# Patient Record
Sex: Male | Born: 1995 | Hispanic: Yes | Marital: Single | Smoking: Former smoker
Health system: Southern US, Community
[De-identification: ages and names within clinical notes are randomized; demographics above are authoritative.]

## PROBLEM LIST (undated history)

## (undated) DIAGNOSIS — I10 Essential (primary) hypertension: Secondary | ICD-10-CM

## (undated) DIAGNOSIS — E669 Obesity, unspecified: Secondary | ICD-10-CM

## (undated) DIAGNOSIS — E119 Type 2 diabetes mellitus without complications: Secondary | ICD-10-CM

## (undated) HISTORY — PX: NO PAST SURGERIES: SHX2092

---
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
## 2016-06-08 ENCOUNTER — Other Ambulatory Visit
Admission: RE | Admit: 2016-06-08 | Discharge: 2016-06-08 | Disposition: A | Discharge status: Home / Self Care | Payer: Medicaid Other | Source: Ambulatory Visit | Attending: Pediatrics | Admitting: Pediatrics

## 2016-06-08 DIAGNOSIS — R1110 Vomiting, unspecified: Secondary | ICD-10-CM | POA: Insufficient documentation

## 2016-06-08 LAB — CBC WITH DIFFERENTIAL/PLATELET
Basophils Absolute: 0 10*3/uL (ref 0–0.1)
Basophils Relative: 0 %
EOS PCT: 3 %
Eosinophils Absolute: 0.2 10*3/uL (ref 0–0.7)
HCT: 42.4 % (ref 40.0–52.0)
Hemoglobin: 15 g/dL (ref 13.0–18.0)
LYMPHS ABS: 2 10*3/uL (ref 1.0–3.6)
LYMPHS PCT: 33 %
MCH: 31.7 pg (ref 26.0–34.0)
MCHC: 35.3 g/dL (ref 32.0–36.0)
MCV: 89.8 fL (ref 80.0–100.0)
MONO ABS: 0.6 10*3/uL (ref 0.2–1.0)
MONOS PCT: 10 %
Neutro Abs: 3.3 10*3/uL (ref 1.4–6.5)
Neutrophils Relative %: 54 %
PLATELETS: 261 10*3/uL (ref 150–440)
RBC: 4.73 MIL/uL (ref 4.40–5.90)
RDW: 13.1 % (ref 11.5–14.5)
WBC: 6.1 10*3/uL (ref 3.8–10.6)

## 2016-06-08 LAB — AMYLASE: Amylase: 44 U/L (ref 28–100)

## 2016-06-08 LAB — COMPREHENSIVE METABOLIC PANEL
ALT: 27 U/L (ref 17–63)
AST: 23 U/L (ref 15–41)
Albumin: 4.6 g/dL (ref 3.5–5.0)
Alkaline Phosphatase: 61 U/L (ref 38–126)
Anion gap: 7 (ref 5–15)
BILIRUBIN TOTAL: 0.7 mg/dL (ref 0.3–1.2)
BUN: 20 mg/dL (ref 6–20)
CHLORIDE: 104 mmol/L (ref 101–111)
CO2: 25 mmol/L (ref 22–32)
Calcium: 9.3 mg/dL (ref 8.9–10.3)
Creatinine, Ser: 1.08 mg/dL (ref 0.61–1.24)
Glucose, Bld: 108 mg/dL — ABNORMAL HIGH (ref 65–99)
POTASSIUM: 4.2 mmol/L (ref 3.5–5.1)
Sodium: 136 mmol/L (ref 135–145)
TOTAL PROTEIN: 7.5 g/dL (ref 6.5–8.1)

## 2016-06-08 LAB — TSH: TSH: 0.554 u[IU]/mL (ref 0.350–4.500)

## 2016-06-08 LAB — LIPASE, BLOOD: LIPASE: 20 U/L (ref 11–51)

## 2016-06-09 LAB — HEMOGLOBIN A1C
Hgb A1c MFr Bld: 5.4 % (ref 4.8–5.6)
MEAN PLASMA GLUCOSE: 108 mg/dL

## 2016-06-12 LAB — INSULIN, RANDOM: INSULIN: 65 u[IU]/mL — AB (ref 2.6–24.9)

## 2017-02-12 ENCOUNTER — Emergency Department: Payer: Self-pay

## 2017-02-12 ENCOUNTER — Observation Stay
Admission: EM | Admit: 2017-02-12 | Discharge: 2017-02-13 | Disposition: A | Discharge status: Home / Self Care | Payer: Self-pay | Attending: Internal Medicine | Admitting: Internal Medicine

## 2017-02-12 DIAGNOSIS — M6282 Rhabdomyolysis: Secondary | ICD-10-CM | POA: Diagnosis present

## 2017-02-12 DIAGNOSIS — I10 Essential (primary) hypertension: Secondary | ICD-10-CM | POA: Diagnosis present

## 2017-02-12 DIAGNOSIS — Z87891 Personal history of nicotine dependence: Secondary | ICD-10-CM | POA: Insufficient documentation

## 2017-02-12 DIAGNOSIS — E669 Obesity, unspecified: Secondary | ICD-10-CM | POA: Insufficient documentation

## 2017-02-12 DIAGNOSIS — R55 Syncope and collapse: Secondary | ICD-10-CM | POA: Insufficient documentation

## 2017-02-12 DIAGNOSIS — E860 Dehydration: Secondary | ICD-10-CM | POA: Diagnosis present

## 2017-02-12 DIAGNOSIS — R748 Abnormal levels of other serum enzymes: Secondary | ICD-10-CM

## 2017-02-12 DIAGNOSIS — X30XXXA Exposure to excessive natural heat, initial encounter: Secondary | ICD-10-CM | POA: Insufficient documentation

## 2017-02-12 DIAGNOSIS — T679XXA Effect of heat and light, unspecified, initial encounter: Principal | ICD-10-CM | POA: Insufficient documentation

## 2017-02-12 DIAGNOSIS — Y9389 Activity, other specified: Secondary | ICD-10-CM | POA: Insufficient documentation

## 2017-02-12 DIAGNOSIS — E119 Type 2 diabetes mellitus without complications: Secondary | ICD-10-CM

## 2017-02-12 HISTORY — DX: Type 2 diabetes mellitus without complications: E11.9

## 2017-02-12 HISTORY — DX: Obesity, unspecified: E66.9

## 2017-02-12 HISTORY — DX: Essential (primary) hypertension: I10

## 2017-02-12 LAB — CK: CK TOTAL: 1652 U/L — AB (ref 49–397)

## 2017-02-12 LAB — BASIC METABOLIC PANEL
Anion gap: 6 (ref 5–15)
BUN: 11 mg/dL (ref 6–20)
CO2: 22 mmol/L (ref 22–32)
CREATININE: 1.15 mg/dL (ref 0.61–1.24)
Calcium: 8.9 mg/dL (ref 8.9–10.3)
Chloride: 110 mmol/L (ref 101–111)
GFR calc Af Amer: >60 mL/min (ref >60)
GFR calc non Af Amer: >60 mL/min (ref >60)
GLUCOSE: 128 mg/dL — AB (ref 65–99)
POTASSIUM: 3.6 mmol/L (ref 3.5–5.1)
SODIUM: 138 mmol/L (ref 135–145)

## 2017-02-12 LAB — HEPATIC FUNCTION PANEL
ALT: 37 U/L (ref 17–63)
AST: 59 U/L — AB (ref 15–41)
Albumin: 4.5 g/dL (ref 3.5–5.0)
Alkaline Phosphatase: 41 U/L (ref 38–126)
BILIRUBIN DIRECT: 0.2 mg/dL (ref 0.1–0.5)
BILIRUBIN TOTAL: 1.2 mg/dL (ref 0.3–1.2)
Indirect Bilirubin: 1 mg/dL — ABNORMAL HIGH (ref 0.3–0.9)
Total Protein: 7.3 g/dL (ref 6.5–8.1)

## 2017-02-12 LAB — CBC WITH DIFFERENTIAL/PLATELET
Basophils Absolute: 0.2 10*3/uL — ABNORMAL HIGH (ref 0–0.1)
Basophils Relative: 2 %
EOS ABS: 0.1 10*3/uL (ref 0–0.7)
EOS PCT: 1 %
HCT: 39 % — ABNORMAL LOW (ref 40.0–52.0)
Hemoglobin: 13.8 g/dL (ref 13.0–18.0)
LYMPHS ABS: 1.2 10*3/uL (ref 1.0–3.6)
Lymphocytes Relative: 11 %
MCH: 32.1 pg (ref 26.0–34.0)
MCHC: 35.3 g/dL (ref 32.0–36.0)
MCV: 90.7 fL (ref 80.0–100.0)
MONO ABS: 0.9 10*3/uL (ref 0.2–1.0)
MONOS PCT: 8 %
Neutro Abs: 9 10*3/uL — ABNORMAL HIGH (ref 1.4–6.5)
Neutrophils Relative %: 78 %
PLATELETS: 291 10*3/uL (ref 150–440)
RBC: 4.3 MIL/uL — ABNORMAL LOW (ref 4.40–5.90)
RDW: 13.3 % (ref 11.5–14.5)
WBC: 11.4 10*3/uL — ABNORMAL HIGH (ref 3.8–10.6)

## 2017-02-12 MED ORDER — SODIUM CHLORIDE 0.9 % IV BOLUS (SEPSIS)
1000.0000 mL | Freq: Once | INTRAVENOUS | Status: AC
  Administered 2017-02-12: 1000 mL via INTRAVENOUS

## 2017-02-12 NOTE — ED Notes (Addendum)
Pt brought in via ems.  Pt reports he has been working in the heat and is not eating or drinking much.  Today pt fell and struck head on pavement.  Pt has abrasion to right forehead.  Pt states loc.  Pt alert on arrival  Iv fluids infusing.  2 iv's in place started by ems.   nsr on monitor.    md at bedside,

## 2017-02-12 NOTE — ED Provider Notes (Signed)
[facility] Emergency Department Provider Note    First MD Initiated Contact with Patient 02/12/17 2000     (approximate)  I have reviewed the triage vital signs and the nursing notes.   HISTORY  Chief Complaint Heat Exposure and Fall    HPI Jeffrey Frank is a 21 y.o. 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 Patient does not remember the events. States he was feeling very hot throughout the day and did not have anything to eat or drink since early this morning. Patient denies any history of syncopal events. Has no family history of sudden cardiac death. Does not have any chest pain or shortness of breath at this time. There is no numbness or tingling. Denies any headache. Patient was brought to the ER by EMS. Glucose is normal. Mildly hyperthermic as well is having some mild tachycardia. Patient does have scattered abrasions to the right forehead but is otherwise well-appearing.   Past Medical History:  Diagnosis Date  . Diabetes mellitus without complication (HCC)    FMH: no sudden cardiac death History reviewed. No pertinent surgical history. There are no active problems to display for this patient.     Prior to Admission medications   Not on File    Allergies Patient has no known allergies.    Social History Social History  Substance Use Topics  . Smoking status: Former Games developer  . Smokeless tobacco: Never Used  . Alcohol use 6.0 oz/week    10 Cans of beer per week     Comment: every 2 weeks    Review of Systems Patient denies headaches, rhinorrhea, blurry vision, numbness, shortness of breath, chest pain, edema, cough, abdominal pain, nausea, vomiting, diarrhea, dysuria, fevers, rashes or hallucinations unless otherwise stated above in HPI. ____________________________________________   PHYSICAL EXAM:  VITAL SIGNS: Vitals:   02/12/17 1958 02/12/17  2012  BP: (!) 136/91 133/79  Pulse: (!) 104 87  Resp: (!) 32 (!) 23  Temp: 99.4 F (37.4 C)     Constitutional: Alert and oriented. Well appearing and in no acute distress. Eyes: Conjunctivae are normal.  Head: superficial abrasion to right forehead Nose: No congestion/rhinnorhea. Mouth/Throat: Mucous membranes are moist.   Neck: No stridor. Painless ROM.  Cardiovascular: Normal rate, regular rhythm. Grossly normal heart sounds.  Good peripheral circulation. Respiratory: Normal respiratory effort.  No retractions. Lungs CTAB. Gastrointestinal: Soft and nontender. No distention. No abdominal bruits. No CVA tenderness. Musculoskeletal: No lower extremity tenderness nor edema.  No joint effusions. Neurologic:  Normal speech and language. No gross focal neurologic deficits are appreciated. No facial droop Skin:  Skin is warm, dry and intact. No rash noted. Psychiatric: Mood and affect are normal. Speech and behavior are normal.  ____________________________________________   LABS (all labs ordered are listed, but only abnormal results are displayed)  Results for orders placed or performed during the hospital encounter of 02/12/17 (from the past 24 hour(s))  CBC with Differential/Platelet     Status: Abnormal   Collection Time: 02/12/17  8:02 PM  Result Value Ref Range   WBC 11.4 (H) 3.8 - 10.6 K/uL   RBC 4.30 (L) 4.40 - 5.90 MIL/uL   Hemoglobin 13.8 13.0 - 18.0 g/dL   HCT 16.1 (L) 09.6 - 04.5 %   MCV 90.7 80.0 - 100.0 fL   MCH 32.1 26.0 - 34.0 pg   MCHC 35.3 32.0 - 36.0 g/dL  RDW 13.3 11.5 - 14.5 %   Platelets 291 150 - 440 K/uL   Neutrophils Relative % 78 %   Neutro Abs 9.0 (H) 1.4 - 6.5 K/uL   Lymphocytes Relative 11 %   Lymphs Abs 1.2 1.0 - 3.6 K/uL   Monocytes Relative 8 %   Monocytes Absolute 0.9 0.2 - 1.0 K/uL   Eosinophils Relative 1 %   Eosinophils Absolute 0.1 0 - 0.7 K/uL   Basophils Relative 2 %   Basophils Absolute 0.2 (H) 0 - 0.1 K/uL    ____________________________________________  EKG My review and personal interpretation at Time: 20:02   Indication: syncope  Rate: 90  Rhythm: sinus Axis: normal Other: normal intervals, no ischemic changes, no wpw, no brugada ____________________________________________  RADIOLOGY  I personally reviewed all radiographic images ordered to evaluate for the above acute complaints and reviewed radiology reports and findings.  These findings were personally discussed with the patient.  Please see medical record for radiology report.  ____________________________________________   PROCEDURES  Procedure(s) performed:  Procedures    Critical Care performed: no ____________________________________________   INITIAL IMPRESSION / ASSESSMENT AND PLAN / ED COURSE  Pertinent labs & imaging results that were available during my care of the patient were reviewed by me and considered in my medical decision making (see chart for details).  DDX: dehydration, heat syncope, electroyle ab, hypoglycemia, dysrhythmia, ich  Jeffrey Frank is a 21 y.o. who presents to the ED with syncopal event as described above. Patient otherwise well appearing. CT imaging will be ordered to evaluate for head injury after syncopal he did have LOC.  No focal neuro deficits at this time. EKG shows no evidence of WPW, Brugada or prolonged QT. Patient does clinically look dehydrated and admits prolonged period of working outside without good oral hydration therefore we will give IV fluids. Patient will be signed out to Dr. Sharma CovertNorman pending results of CT imaging and lab tests. The patient's well appearing and do feel that he be appropriate for discharge home pending reassuring lab work can clinical course.      ____________________________________________   FINAL CLINICAL IMPRESSION(S) / ED DIAGNOSES  Final diagnoses:  Syncope, unspecified syncope type      NEW MEDICATIONS STARTED DURING THIS VISIT:  New  Prescriptions   No medications on file     Note:  This document was prepared using Dragon voice recognition software and may include unintentional dictation errors.    Willy Eddyobinson, Saurabh Hettich, MD 02/12/17 2053

## 2017-02-12 NOTE — H&P (Signed)
[facility] - [place] at [facility]   PATIENT NAME: Jeffrey Frank    MR#:  [id number]  DATE OF BIRTH:  1996-05-15  DATE OF ADMISSION:  02/12/2017  PRIMARY CARE PHYSICIAN: Patient, No Pcp Per   REQUESTING/REFERRING PHYSICIAN: Sharma Covert, MD  CHIEF COMPLAINT:   Chief Complaint  Patient presents with  . Heat Exposure  . Fall    HISTORY OF PRESENT ILLNESS:  Josean Lycan  is a 21 y.o. male who presents with Syncopal episode after significant heat exposure. 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 He states that he currently has muscular tenderness, especially in his legs. Here in the ED he was found to be significantly dehydrated, with elevation in his CK.  PAST MEDICAL HISTORY:   Past Medical History:  Diagnosis Date  . Diabetes mellitus without complication (HCC)   . HTN (hypertension)   . Obesity     PAST SURGICAL HISTORY:   Past Surgical History:  Procedure Laterality Date  . NO PAST SURGERIES      SOCIAL HISTORY:   Social History  Substance Use Topics  . Smoking status: Former Games developer  . Smokeless tobacco: Never Used  . Alcohol use 6.0 oz/week    10 Cans of beer per week     Comment: every 2 weeks    FAMILY HISTORY:   Family History  Problem Relation Age of Onset  . Obesity Other   . Diabetes Other     DRUG ALLERGIES:  No Known Allergies  MEDICATIONS AT HOME:   Prior to Admission medications   Not on File    REVIEW OF SYSTEMS:  Review of Systems  Constitutional: Negative for chills, fever, malaise/fatigue and weight loss.  HENT: Negative for ear pain, hearing loss and tinnitus.   Eyes: Negative for blurred vision, double vision, pain and redness.  Respiratory: Negative for cough, hemoptysis and shortness of breath.   Cardiovascular: Negative for chest pain, palpitations, orthopnea and leg swelling.  Gastrointestinal: Negative for abdominal pain, constipation,  diarrhea, nausea and vomiting.  Genitourinary: Negative for dysuria, frequency and hematuria.  Musculoskeletal: Negative for back pain, joint pain and neck pain.       Muscular tenderness, especially thigh muscle tenderness  Skin:       No acne, rash, or lesions  Neurological: Positive for loss of consciousness. Negative for dizziness, tremors, focal weakness and weakness.  Endo/Heme/Allergies: Negative for polydipsia. Does not bruise/bleed easily.  Psychiatric/Behavioral: Negative for depression. The patient is not nervous/anxious and does not have insomnia.      VITAL SIGNS:   Vitals:   02/12/17 2012 02/12/17 2100 02/12/17 2120 02/12/17 2140  BP: 133/79 140/73 122/74 119/70  Pulse: 87 90 71 66  Resp: (!) 23 (!) 30 (!) 21 (!) 26  Temp:      TempSrc:      SpO2: 97% 98% 98% 98%  Weight:      Height:       Wt Readings from Last 3 Encounters:  02/12/17 95.3 kg (210 lb)    PHYSICAL EXAMINATION:  Physical Exam  Vitals reviewed. Constitutional: He is oriented to person, place, and time. He appears well-developed and well-nourished. No distress.  HENT:  Head: Normocephalic and atraumatic.  Dry mucous membranes  Eyes: Conjunctivae and EOM are normal. Pupils are equal, round, and reactive to light. No scleral icterus.  Neck: Normal range of motion. Neck supple. No JVD present. No thyromegaly present.  Cardiovascular: Normal rate, regular rhythm and intact distal pulses.  Exam reveals no gallop and no friction rub.   No murmur heard. Respiratory: Effort normal and breath sounds normal. No respiratory distress. He has no wheezes. He has no rales.  GI: Soft. Bowel sounds are normal. He exhibits no distension. There is no tenderness.  Musculoskeletal: Normal range of motion. He exhibits tenderness (Thigh muscle tenderness). He exhibits no edema.  No arthritis, no gout  Lymphadenopathy:    He has no cervical adenopathy.  Neurological: He is alert and oriented to person, place, and  time. No cranial nerve deficit.  No dysarthria, no aphasia  Skin: Skin is warm and dry. No rash noted. No erythema.  Psychiatric: He has a normal mood and affect. His behavior is normal. Judgment and thought content normal.    LABORATORY PANEL:   CBC  Recent Labs Lab 02/12/17 2002  WBC 11.4*  HGB 13.8  HCT 39.0*  PLT 291   ------------------------------------------------------------------------------------------------------------------  Chemistries   Recent Labs Lab 02/12/17 2002  NA 138  K 3.6  CL 110  CO2 22  GLUCOSE 128*  BUN 11  CREATININE 1.15  CALCIUM 8.9  AST 59*  ALT 37  ALKPHOS 41  BILITOT 1.2   ------------------------------------------------------------------------------------------------------------------  Cardiac Enzymes No results for input(s): TROPONINI in the last 168 hours. ------------------------------------------------------------------------------------------------------------------  RADIOLOGY:  Ct Head Wo Contrast  Result Date: 02/12/2017 CLINICAL DATA:  Heat exhaustion. EXAM: CT HEAD WITHOUT CONTRAST TECHNIQUE: Contiguous axial images were obtained from the base of the skull through the vertex without intravenous contrast. COMPARISON:  None. FINDINGS: Brain: Gray-white differentiation is maintained. No CT evidence of acute large territory infarct. No intraparenchymal or extra-axial mass or hemorrhage. 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 Otherwise, normal size a configuration the ventricles and basilar cisterns. No midline shift. Vascular: No hyperdense vessel or unexpected calcification. Skull: No displaced calvarial fracture. Sinuses/Orbits: Limited visualization the paranasal sinuses and mastoid air cells is normal. No air-fluid levels. Other: Regional soft tissues appear normal. IMPRESSION: Negative noncontrast head CT. Electronically Signed   By: Simonne Come M.D.   On: 02/12/2017 20:58   Dg Chest Portable 1 View  Result Date:  02/12/2017 CLINICAL DATA:  Syncopal episode, now with heat exhaustion. EXAM: PORTABLE CHEST 1 VIEW COMPARISON:  None. FINDINGS: Borderline enlarged cardiac silhouette, potentially accentuated due to decreased lung volumes and AP projection. Minimal bibasilar heterogeneous opacities, left greater than right. No pleural effusion or pneumothorax. No evidence of edema. No acute osseus abnormalities. IMPRESSION: 1. Minimal bibasilar heterogeneous opacities, left greater than right, atelectasis versus infiltrate. Further evaluation with a PA and lateral chest radiograph may be obtained as clinically indicated. 2. Borderline cardiomegaly, potentially accentuated due to decreased lung volumes and AP projection. Electronically Signed   By: Simonne Come M.D.   On: 02/12/2017 20:56    EKG:   Orders placed or performed during the hospital encounter of 02/12/17  . EKG 12-Lead  . EKG 12-Lead    IMPRESSION AND PLAN:  Principal Problem:   Dehydration - IV fluids for hydration, kidney function is within normal limits Active Problems:   Rhabdomyolysis - elevated CK, IV fluids as above, monitor renal functioning, check a.m. CK level   Diabetes (HCC) - glucose checks, sliding scale insulin if needed   HTN (hypertension) - monitor, patient is not on home meds for this  All the records are reviewed and case discussed with ED provider. Management plans discussed with the patient and/or family.  DVT  PROPHYLAXIS: SubQ lovenox  GI PROPHYLAXIS: None  ADMISSION STATUS: Observation  CODE STATUS: Full Code Status History    This patient does not have a recorded code status. Please follow your organizational policy for patients in this situation.      TOTAL TIME TAKING CARE OF THIS PATIENT: 40 minutes.   Shay Jhaveri FIELDING 02/12/2017, 10:57 PM  [facility]  Office  [phone]  CC: Primary care physician; Patient, No Pcp Per  Note:  This document was prepared using Dragon voice  recognition software and may include unintentional dictation errors.

## 2017-02-12 NOTE — ED Provider Notes (Addendum)
This patient was signed out to me by Dr. Willy EddyPatrick Frank. 21 year old male with heat exposure and resulting syncopal episode. I will plan to admit the patient for an elevated CK of 1652, but his creatinine is reassuring and the patient is clinically improving in the emergency department.  ED ECG REPORT I, Rockne MenghiniNorman, Anne-Caroline, the attending physician, personally viewed and interpreted this ECG.   Date: 02/12/2017  EKG Time: 2002  Rate: 92  Rhythm: normal sinus rhythm  Axis: normal  Intervals:none  ST&T Change: No STEMI    Rockne MenghiniNorman, Anne-Caroline, MD 02/12/17 [id number]    Rockne MenghiniNorman, Anne-Caroline, MD 02/12/17 2206

## 2017-02-12 NOTE — ED Triage Notes (Signed)
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

## 2017-02-13 DIAGNOSIS — M6282 Rhabdomyolysis: Secondary | ICD-10-CM | POA: Diagnosis present

## 2017-02-13 LAB — CBC
HEMATOCRIT: 34.9 % — AB (ref 40.0–52.0)
Hemoglobin: 12.7 g/dL — ABNORMAL LOW (ref 13.0–18.0)
MCH: 32.4 pg (ref 26.0–34.0)
MCHC: 36.3 g/dL — AB (ref 32.0–36.0)
MCV: 89 fL (ref 80.0–100.0)
Platelets: 267 10*3/uL (ref 150–440)
RBC: 3.92 MIL/uL — ABNORMAL LOW (ref 4.40–5.90)
RDW: 13.3 % (ref 11.5–14.5)
WBC: 9.9 10*3/uL (ref 3.8–10.6)

## 2017-02-13 LAB — BASIC METABOLIC PANEL
Anion gap: 6 (ref 5–15)
BUN: 6 mg/dL (ref 6–20)
CHLORIDE: 113 mmol/L — AB (ref 101–111)
CO2: 23 mmol/L (ref 22–32)
Calcium: 8.5 mg/dL — ABNORMAL LOW (ref 8.9–10.3)
Creatinine, Ser: 0.97 mg/dL (ref 0.61–1.24)
GFR calc Af Amer: >60 mL/min (ref >60)
GFR calc non Af Amer: >60 mL/min (ref >60)
GLUCOSE: 102 mg/dL — AB (ref 65–99)
POTASSIUM: 3.2 mmol/L — AB (ref 3.5–5.1)
Sodium: 142 mmol/L (ref 135–145)

## 2017-02-13 LAB — CK: Total CK: 1196 U/L — ABNORMAL HIGH (ref 49–397)

## 2017-02-13 LAB — GLUCOSE, CAPILLARY
Glucose-Capillary: 105 mg/dL — ABNORMAL HIGH (ref 65–99)
Glucose-Capillary: 109 mg/dL — ABNORMAL HIGH (ref 65–99)

## 2017-02-13 MED ORDER — ENOXAPARIN SODIUM 40 MG/0.4ML ~~LOC~~ SOLN: 40.0000 mg | SUBCUTANEOUS | Status: DC

## 2017-02-13 MED ORDER — INSULIN ASPART 100 UNIT/ML ~~LOC~~ SOLN: 0.0000 [IU] | Freq: Three times a day (TID) | SUBCUTANEOUS | Status: DC

## 2017-02-13 MED ORDER — SODIUM CHLORIDE 0.9 % IV SOLN
INTRAVENOUS | Status: AC
  Administered 2017-02-13 (×2): via INTRAVENOUS

## 2017-02-13 MED ORDER — ACETAMINOPHEN 650 MG RE SUPP: 650.0000 mg | Freq: Four times a day (QID) | RECTAL | Status: DC | PRN

## 2017-02-13 MED ORDER — ACETAMINOPHEN 325 MG PO TABS
650.0000 mg | ORAL_TABLET | Freq: Four times a day (QID) | ORAL | Status: DC | PRN
  Administered 2017-02-13: 650 mg via ORAL
  Filled 2017-02-13: qty 2

## 2017-02-13 MED ORDER — ONDANSETRON HCL 4 MG PO TABS: 4.0000 mg | ORAL_TABLET | Freq: Four times a day (QID) | ORAL | Status: DC | PRN

## 2017-02-13 MED ORDER — INSULIN ASPART 100 UNIT/ML ~~LOC~~ SOLN: 0.0000 [IU] | Freq: Every day | SUBCUTANEOUS | Status: DC

## 2017-02-13 MED ORDER — POTASSIUM CHLORIDE CRYS ER 20 MEQ PO TBCR: 40.0000 meq | EXTENDED_RELEASE_TABLET | Freq: Once | ORAL | Status: DC

## 2017-02-13 MED ORDER — ONDANSETRON HCL 4 MG/2ML IJ SOLN: 4.0000 mg | Freq: Four times a day (QID) | INTRAMUSCULAR | Status: DC | PRN

## 2017-02-13 NOTE — ED Notes (Signed)
Report  Called to crystal rn floor nurse

## 2017-02-13 NOTE — Progress Notes (Signed)
Pt to be discharged per MD order. IV removed. Instructions reviewed with pt and family, all questions answered. NO new medications or follow ups. Will discharge downstairs in wheelchair.

## 2017-02-13 NOTE — Care Management (Signed)
Patient admitted with Heat exhaustion with Dehydration.  Patient is uninsured, self pay.  Patient will not discharge on any home medications.  Patient states that he does not have a PCP.  [facility].  RNCM signing off.

## 2017-02-13 NOTE — Discharge Instructions (Signed)
Advised plenty of fluids and gatorade when working outdoors

## 2017-02-13 NOTE — Discharge Summary (Signed)
[facility] - [place] at [facility]   PATIENT NAME: Jeffrey Frank    MR#:  [id number]  DATE OF BIRTH:  October 13, 1995  DATE OF ADMISSION:  02/12/2017 ADMITTING PHYSICIAN: Oralia Manis, MD  DATE OF DISCHARGE: 02/13/17  PRIMARY CARE PHYSICIAN: Patient, No Pcp Per    ADMISSION DIAGNOSIS:  Elevated CK [R74.8] Heat exposure, initial encounter [T67.9XXA] Syncope, unspecified syncope type [R55]  DISCHARGE DIAGNOSIS:  Heat exhaustion Mild acute Rhabdomyolysis  SECONDARY DIAGNOSIS:   Past Medical History:  Diagnosis Date  . Diabetes mellitus without complication (HCC)   . HTN (hypertension)   . Obesity     HOSPITAL COURSE:  Jeffrey Frank  is a 21 y.o. male who presents with Syncopal episode after significant heat exposure. 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 He states that he currently has muscular tenderness, especially in his legs. Here in the ED he was found to be significantly dehydrated, with elevation in his CK.  1. Heat exhaustion with Dehydration  -recieved IV fluids for hydration, kidney function is within normal limits  2.Acute mild  Rhabdomyolysis - elevated CK, IV fluids as above, monitor renal functioning -CK level trending down -pt advised gatorade and water when outdoors. -prn tylenol  3.  Diabetes (HCC) - glucose checks, sliding scale insulin if needed -sugars <110 -diet control  4.  HTN (hypertension) - monitor, patient is not on home meds for this BP stable. Does not need meds  D/c home later today  CONSULTS OBTAINED:    DRUG ALLERGIES:  No Known Allergies  DISCHARGE MEDICATIONS:  There are no discharge medications for this patient.   If you experience worsening of your admission symptoms, develop shortness of breath, life threatening emergency, suicidal or homicidal thoughts you must seek medical attention immediately by calling 911 or calling your MD immediately   if symptoms less severe.  You Must read complete instructions/literature along with all the possible adverse reactions/side effects for all the Medicines you take and that have been prescribed to you. Take any new Medicines after you have completely understood and accept all the possible adverse reactions/side effects.   Please note  You were cared for by a hospitalist during your hospital stay. If you have any questions about your discharge medications or the care you received while you were in the hospital after you are discharged, you can call the unit and asked to speak with the hospitalist on call if the hospitalist that took care of you is not available. Once you are discharged, your primary care physician will handle any further medical issues. Please note that NO REFILLS for any discharge medications will be authorized once you are discharged, as it is imperative that you return to your primary care physician (or establish a relationship with a primary care physician if you do not have one) for your aftercare needs so that they can reassess your need for medications and monitor your lab values. Today   SUBJECTIVE   Mild LE cramps. Eating well  VITAL SIGNS:  Blood pressure 127/64, pulse 61, temperature 99.9 F (37.7 C), temperature source Oral, resp. rate 18, height 5\' 5"  (1.651 m), weight 97.6 kg (215 lb 3.2 oz), SpO2 100 %.  I/O:   Intake/Output Summary (Last 24 hours) at 02/13/17 1047 Last data filed at 02/13/17 0900  Gross per 24 hour  Intake          1148.33 ml  Output  800 ml  Net           348.33 ml    PHYSICAL EXAMINATION:  GENERAL:  21 y.o.-year-old patient lying in the bed with no acute distress. obese EYES: Pupils equal, round, reactive to light and accommodation. No scleral icterus. Extraocular muscles intact.  HEENT: Head atraumatic, normocephalic. Oropharynx and nasopharynx clear.  NECK:  Supple, no jugular venous distention. No thyroid enlargement, no  tenderness.  LUNGS: Normal breath sounds bilaterally, no wheezing, rales,rhonchi or crepitation. No use of accessory muscles of respiration.  CARDIOVASCULAR: S1, S2 normal. No murmurs, rubs, or gallops.  ABDOMEN: Soft, non-tender, non-distended. Bowel sounds present. No organomegaly or mass.  EXTREMITIES: No pedal edema, cyanosis, or clubbing.  NEUROLOGIC: Cranial nerves II through XII are intact. Muscle strength 5/5 in all extremities. Sensation intact. Gait not checked.  PSYCHIATRIC: The patient is alert and oriented x 3.  SKIN: No obvious rash, lesion, or ulcer.   DATA REVIEW:   CBC   Recent Labs Lab 02/13/17 0505  WBC 9.9  HGB 12.7*  HCT 34.9*  PLT 267    Chemistries   Recent Labs Lab 02/12/17 2002 02/13/17 0505  NA 138 142  K 3.6 3.2*  CL 110 113*  CO2 22 23  GLUCOSE 128* 102*  BUN 11 6  CREATININE 1.15 0.97  CALCIUM 8.9 8.5*  AST 59*  --   ALT 37  --   ALKPHOS 41  --   BILITOT 1.2  --     Microbiology Results   No results found for this or any previous visit (from the past 240 hour(s)).  RADIOLOGY:  Ct Head Wo Contrast  Result Date: 02/12/2017 CLINICAL DATA:  Heat exhaustion. EXAM: CT HEAD WITHOUT CONTRAST TECHNIQUE: Contiguous axial images were obtained from the base of the skull through the vertex without intravenous contrast. COMPARISON:  None. FINDINGS: Brain: Gray-white differentiation is maintained. No CT evidence of acute large territory infarct. No intraparenchymal or extra-axial mass or hemorrhage. 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 Otherwise, normal size a configuration the ventricles and basilar cisterns. No midline shift. Vascular: No hyperdense vessel or unexpected calcification. Skull: No displaced calvarial fracture. Sinuses/Orbits: Limited visualization the paranasal sinuses and mastoid air cells is normal. No air-fluid levels. Other: Regional soft tissues appear normal. IMPRESSION: Negative noncontrast head CT. Electronically Signed   By:  Simonne Come M.D.   On: 02/12/2017 20:58   Dg Chest Portable 1 View  Result Date: 02/12/2017 CLINICAL DATA:  Syncopal episode, now with heat exhaustion. EXAM: PORTABLE CHEST 1 VIEW COMPARISON:  None. FINDINGS: Borderline enlarged cardiac silhouette, potentially accentuated due to decreased lung volumes and AP projection. Minimal bibasilar heterogeneous opacities, left greater than right. No pleural effusion or pneumothorax. No evidence of edema. No acute osseus abnormalities. IMPRESSION: 1. Minimal bibasilar heterogeneous opacities, left greater than right, atelectasis versus infiltrate. Further evaluation with a PA and lateral chest radiograph may be obtained as clinically indicated. 2. Borderline cardiomegaly, potentially accentuated due to decreased lung volumes and AP projection. Electronically Signed   By: Simonne Come M.D.   On: 02/12/2017 20:56     Management plans discussed with the patient, family and they are in agreement.  CODE STATUS:     Code Status Orders        Start     Ordered   02/13/17 0013  Full code  Continuous     02/13/17 0012    Code Status History    Date Active Date Inactive Code  Status Order ID Comments User Context   This patient has a current code status but no historical code status.      TOTAL TIME TAKING CARE OF THIS PATIENT: 40 minutes.    Hadassah Rana M.D on 02/13/2017 at 10:47 AM  Between 7am to 6pm - Pager - [phone] After 6pm go to [email or web address] - password [email or web address]  [facility]  Office  [phone]  CC: Primary care physician; Patient, No Pcp Per

## 2017-02-14 LAB — HEMOGLOBIN A1C
HEMOGLOBIN A1C: 5.1 % (ref 4.8–5.6)
Mean Plasma Glucose: 100 mg/dL

## 2017-07-14 ENCOUNTER — Other Ambulatory Visit: Payer: Self-pay

## 2017-07-14 ENCOUNTER — Emergency Department: Payer: Self-pay

## 2017-07-14 ENCOUNTER — Encounter: Payer: Self-pay | Admitting: Emergency Medicine

## 2017-07-14 ENCOUNTER — Emergency Department
Admission: EM | Admit: 2017-07-14 | Discharge: 2017-07-14 | Disposition: A | Discharge status: Home / Self Care | Payer: Self-pay | Attending: Emergency Medicine | Admitting: Emergency Medicine

## 2017-07-14 DIAGNOSIS — Z87891 Personal history of nicotine dependence: Secondary | ICD-10-CM | POA: Insufficient documentation

## 2017-07-14 DIAGNOSIS — E119 Type 2 diabetes mellitus without complications: Secondary | ICD-10-CM | POA: Insufficient documentation

## 2017-07-14 DIAGNOSIS — R569 Unspecified convulsions: Secondary | ICD-10-CM | POA: Insufficient documentation

## 2017-07-14 DIAGNOSIS — I10 Essential (primary) hypertension: Secondary | ICD-10-CM | POA: Insufficient documentation

## 2017-07-14 LAB — URINE DRUG SCREEN, QUALITATIVE (ARMC ONLY)
AMPHETAMINES, UR SCREEN: NOT DETECTED
BARBITURATES, UR SCREEN: NOT DETECTED
BENZODIAZEPINE, UR SCRN: NOT DETECTED
Cannabinoid 50 Ng, Ur ~~LOC~~: POSITIVE — AB
Cocaine Metabolite,Ur ~~LOC~~: NOT DETECTED
MDMA (Ecstasy)Ur Screen: NOT DETECTED
Methadone Scn, Ur: NOT DETECTED
Opiate, Ur Screen: NOT DETECTED
PHENCYCLIDINE (PCP) UR S: NOT DETECTED
Tricyclic, Ur Screen: NOT DETECTED

## 2017-07-14 LAB — URINALYSIS, COMPLETE (UACMP) WITH MICROSCOPIC
Bacteria, UA: NONE SEEN
Bilirubin Urine: NEGATIVE
Glucose, UA: >=500 mg/dL — AB
KETONES UR: 5 mg/dL — AB
Leukocytes, UA: NEGATIVE
Nitrite: NEGATIVE
PH: 6 (ref 5.0–8.0)
Protein, ur: 100 mg/dL — AB
SPECIFIC GRAVITY, URINE: 1.02 (ref 1.005–1.030)

## 2017-07-14 LAB — BASIC METABOLIC PANEL
Anion gap: 16 — ABNORMAL HIGH (ref 5–15)
BUN: 16 mg/dL (ref 6–20)
CHLORIDE: 102 mmol/L (ref 101–111)
CO2: 17 mmol/L — ABNORMAL LOW (ref 22–32)
CREATININE: 1.2 mg/dL (ref 0.61–1.24)
Calcium: 9.5 mg/dL (ref 8.9–10.3)
GFR calc Af Amer: >60 mL/min (ref >60)
Glucose, Bld: 244 mg/dL — ABNORMAL HIGH (ref 65–99)
Potassium: 3.3 mmol/L — ABNORMAL LOW (ref 3.5–5.1)
SODIUM: 135 mmol/L (ref 135–145)

## 2017-07-14 LAB — CBC
HCT: 45.6 % (ref 40.0–52.0)
Hemoglobin: 15.9 g/dL (ref 13.0–18.0)
MCH: 32 pg (ref 26.0–34.0)
MCHC: 34.9 g/dL (ref 32.0–36.0)
MCV: 91.6 fL (ref 80.0–100.0)
PLATELETS: 374 10*3/uL (ref 150–440)
RBC: 4.98 MIL/uL (ref 4.40–5.90)
RDW: 13.3 % (ref 11.5–14.5)
WBC: 12.4 10*3/uL — AB (ref 3.8–10.6)

## 2017-07-14 MED ORDER — LEVETIRACETAM 500 MG/5ML IV SOLN
1000.0000 mg | Freq: Once | INTRAVENOUS | Status: AC
  Administered 2017-07-14: 1000 mg via INTRAVENOUS
  Filled 2017-07-14: qty 10

## 2017-07-14 MED ORDER — SODIUM CHLORIDE 0.9 % IV BOLUS (SEPSIS)
1000.0000 mL | Freq: Once | INTRAVENOUS | Status: AC
  Administered 2017-07-14: 1000 mL via INTRAVENOUS

## 2017-07-14 NOTE — ED Triage Notes (Signed)
Patient presents to Emergency Department via EMS with complaints of mother witnessed seizure.  Pt has no hx of seizures.   Per EMS pt was confused to birth date and current event questions at first contact.  Pt reports xanax use stopped last month, cocaine ("snorting") last use last week, marijuana use today.

## 2017-07-14 NOTE — ED Provider Notes (Signed)
[facility] Emergency Department Provider Note   ____________________________________________   First MD Initiated Contact with Patient 07/14/17 0250     (approximate)  I have reviewed the triage vital signs and the nursing notes.   HISTORY  Chief Complaint Seizures    HPI Jeffrey Frank is a 21 y.o. male who comes into the hospital today with a presumed seizure.  He reports that his parents told him that he had a seizure.  The patient states that he has never had a seizure before but his parents told him that he had another seizure about a month ago.  The patient states that he was checked out but cannot give me any information about what occurred.  He reports that he last remembers being on his bed with his phone.  He fell asleep and then woke up a few hours later with a EMS arrived.  The patient's father states that he was shaking on the bed and it lasted about 5-10 minutes.  He was then very confused and sleepy afterwards.  The patient states that he felt fine earlier today.  He did bite his tongue.  He denies any vomiting or pain anywhere.  The patient does not take any medications for seizures.  He reports that he does smoke marijuana but otherwise has no other complaints.  The patient was brought in for evaluation of his symptoms.  Past Medical History:  Diagnosis Date  . Diabetes mellitus without complication (HCC)   . HTN (hypertension)   . Obesity     Patient Active Problem List   Diagnosis Date Noted  . Rhabdomyolysis 02/13/2017  . Dehydration 02/12/2017  . Diabetes (HCC) 02/12/2017  . HTN (hypertension) 02/12/2017    Past Surgical History:  Procedure Laterality Date  . NO PAST SURGERIES      Prior to Admission medications   Not on File    Allergies Patient has no known allergies.  Family History  Problem Relation Age of Onset  . Obesity Other   . Diabetes Other     Social History Social History   Tobacco Use  . Smoking  status: Former Games developermoker  . Smokeless tobacco: Never Used  Substance Use Topics  . Alcohol use: Yes    Alcohol/week: 6.0 oz    Types: 10 Cans of beer per week    Comment: every 2 weeks  . Drug use: Yes    Types: Marijuana, Cocaine    Comment: every day    Review of Systems  Constitutional: No fever/chills Eyes: No visual changes. ENT: No sore throat. Cardiovascular: Denies chest pain. Respiratory: Denies shortness of breath. Gastrointestinal: No abdominal pain.  No nausea, no vomiting.  No diarrhea.  No constipation. Genitourinary: Negative for dysuria. Musculoskeletal: Negative for back pain. Skin: Negative for rash. Neurological: Seizure   ____________________________________________   PHYSICAL EXAM:  VITAL SIGNS: ED Triage Vitals  Enc Vitals Group     BP 07/14/17 0232 (!) 144/86     Pulse Rate 07/14/17 0232 (!) 114     Resp 07/14/17 0232 (!) 34     Temp 07/14/17 0232 98.9 F (37.2 C)     Temp Source 07/14/17 0232 Oral     SpO2 07/14/17 0232 96 %     Weight 07/14/17 0235 200 lb (90.7 kg)     Height 07/14/17 0235 5\' 5"  (1.651 m)     Head Circumference --      Peak Flow --      Pain Score 07/14/17  0630 Asleep     Pain Loc --      Pain Edu? --      Excl. in GC? --     Constitutional: Alert and oriented. Well appearing and in no acute distress. Eyes: Conjunctivae are normal. PERRL. EOMI. Head: Atraumatic. Nose: No congestion/rhinnorhea. Mouth/Throat: Mucous membranes are moist.  Oropharynx non-erythematous.  Bruise to left tongue Cardiovascular: Normal rate, regular rhythm. Grossly normal heart sounds.  Good peripheral circulation. Respiratory: Normal respiratory effort.  No retractions. Lungs CTAB. Gastrointestinal: Soft and nontender. No distention.  Positive bowel sounds Musculoskeletal: No lower extremity tenderness nor edema.   Neurologic:  Normal speech and language.  Cranial nerves II through XII are grossly intact with no focal motor neuro  deficits Skin:  Skin is warm, dry and intact.  Psychiatric: Mood and affect are normal.   ____________________________________________   LABS (all labs ordered are listed, but only abnormal results are displayed)  Labs Reviewed  BASIC METABOLIC PANEL - Abnormal; Notable for the following components:      Result Value   Potassium 3.3 (*)    CO2 17 (*)    Glucose, Bld 244 (*)    Anion gap 16 (*)    All other components within normal limits  CBC - Abnormal; Notable for the following components:   WBC 12.4 (*)    All other components within normal limits  URINE DRUG SCREEN, QUALITATIVE ([facility] ONLY) - Abnormal; Notable for the following components:   Cannabinoid 50 Ng, Ur [place] POSITIVE (*)    All other components within normal limits  URINALYSIS, COMPLETE (UACMP) WITH MICROSCOPIC - Abnormal; Notable for the following components:   Color, Urine YELLOW (*)    APPearance CLOUDY (*)    Glucose, UA >=500 (*)    Hgb urine dipstick SMALL (*)    Ketones, ur 5 (*)    Protein, ur 100 (*)    Squamous Epithelial / LPF 0-5 (*)    All other components within normal limits   ____________________________________________  EKG ED ECG REPORT I, Rebecka Apley, the attending physician, personally viewed and interpreted this ECG.   Date: 07/14/2017  EKG Time: 227  Rate: 131  Rhythm: sinus tachycardia  Axis: normal  Intervals:none  ST&T Change: t wave inversion in lead III, aVF no STEMI  ____________________________________________  RADIOLOGY  Ct Head Wo Contrast  Result Date: 07/14/2017 CLINICAL DATA:  Seizure EXAM: CT HEAD WITHOUT CONTRAST TECHNIQUE: Contiguous axial images were obtained from the base of the skull through the vertex without intravenous contrast. COMPARISON:  02/12/2017 FINDINGS: Brain: No evidence of acute infarction, hemorrhage, hydrocephalus, extra-axial collection or mass lesion/mass effect. Vascular: No hyperdense vessel or unexpected calcification. Skull: Normal.  Negative for fracture or focal lesion. Sinuses/Orbits: No acute finding. Other: None IMPRESSION: No CT evidence for acute intracranial abnormality Electronically Signed   By: Jasmine Pang M.D.   On: 07/14/2017 04:04    ____________________________________________   PROCEDURES  Procedure(s) performed: None  Procedures  Critical Care performed: No  ____________________________________________   INITIAL IMPRESSION / ASSESSMENT AND PLAN / ED COURSE  As part of my medical decision making, I reviewed the following data within the electronic MEDICAL RECORD NUMBER Notes from prior ED visits and [place] Controlled Substance Database   This is a 21 year old male who comes into the hospital today with a seizure.  It is questionable as to whether this is the patient's first or second seizure.  He reports that he has not had any before but  then he states his parents told him he had one about a month ago.  Although the patient states that he was seen and evaluated for his seizure I do not see any notes in our system stating that he was seen.  The patient did receive a CT scan and some blood work which was unremarkable.  The patient's sugars were elevated but he does have a history of diabetes.  I did give the patient dose of Keppra in the event that this is the second seizure.  I will discharge the patient to home to have him follow-up with neurology.  The patient has no other complaints at this time.      ____________________________________________   FINAL CLINICAL IMPRESSION(S) / ED DIAGNOSES  Final diagnoses:  Seizure [facility](HCC)     ED Discharge Orders    None       Note:  This document was prepared using Dragon voice recognition software and may include unintentional dictation errors.    Rebecka ApleyWebster, Ronella Plunk P, MD 07/14/17 [phone]

## 2017-07-14 NOTE — Discharge Instructions (Signed)
Please follow up with neurology for further evaluation of your seizure. Please ensure that you are getting adequate sleep and make sure that you are not doing drugs. Please return with any worsened conditions or any subsequent seizure.

## 2017-07-14 NOTE — ED Notes (Signed)
Pt reports last marijuana use at 1500 yesterday, last remembers lying in bed playing with cell phone and then EMS were in the house.  Now Pt reports "I feel weird" when asked how pt feels, left front tongue appears bit, pt denies incontinence, CMS intact to extremities

## 2018-02-26 IMAGING — CT CT HEAD W/O CM
3 series · 16 of 47 positions shown, 19 images · non-contrast
Comparison: 02/12/2017

CLINICAL DATA: Seizure

EXAM:
CT HEAD WITHOUT CONTRAST
TECHNIQUE: Contiguous axial images were obtained from the base of the skull
through the vertex without intravenous contrast.

[Series 2: head wo · axial · 0.42mm/px · z∈[-83,+42]mm · 10 of 30 slices shown, 13 images]
[im 3/30  brain]
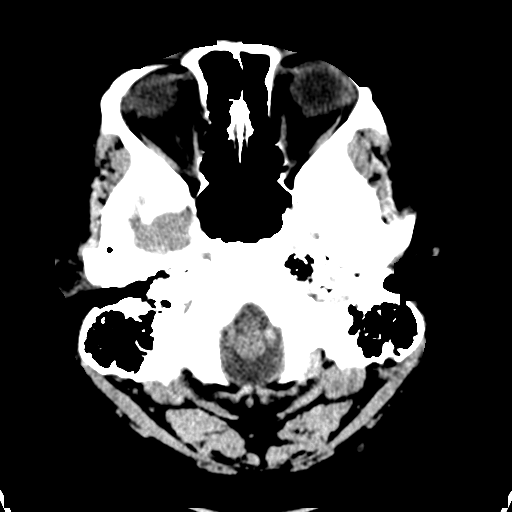
[im 3/30  bone]
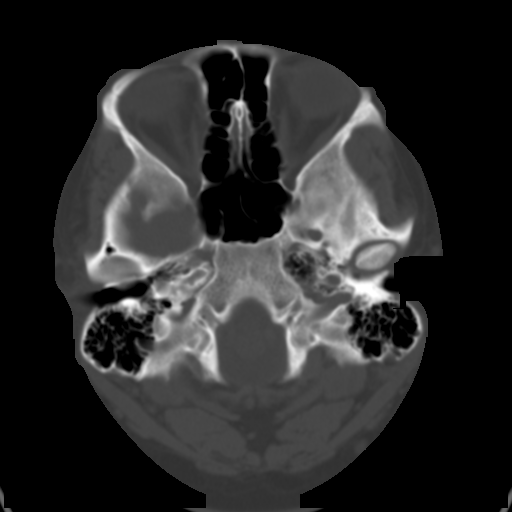
[im 6/30  brain]
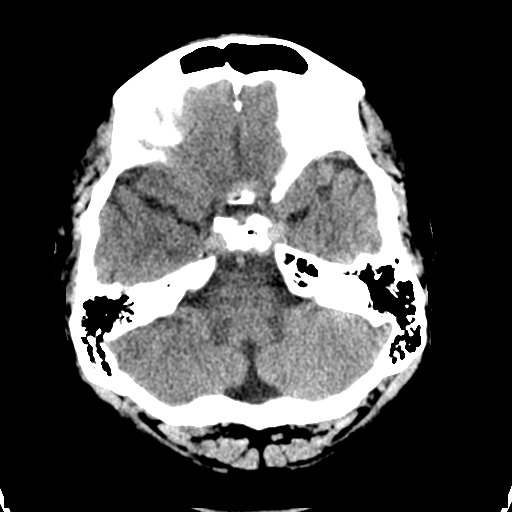
[im 9/30  brain]
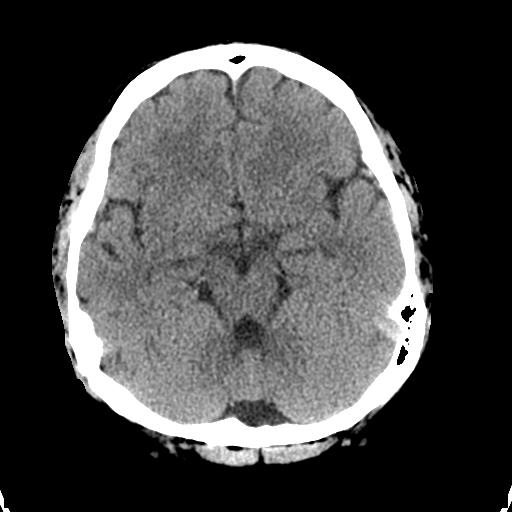
[im 11/30  brain]
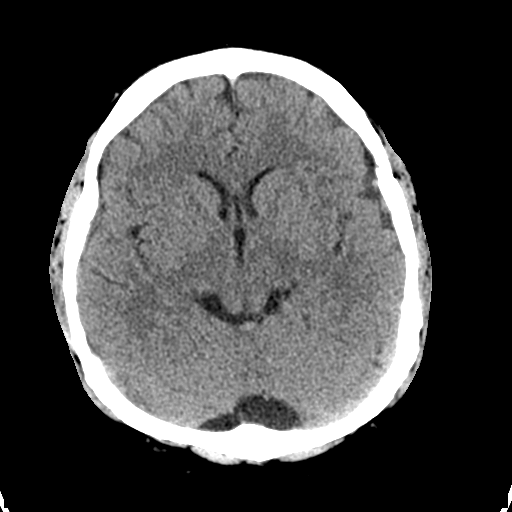
[im 14/30  brain]
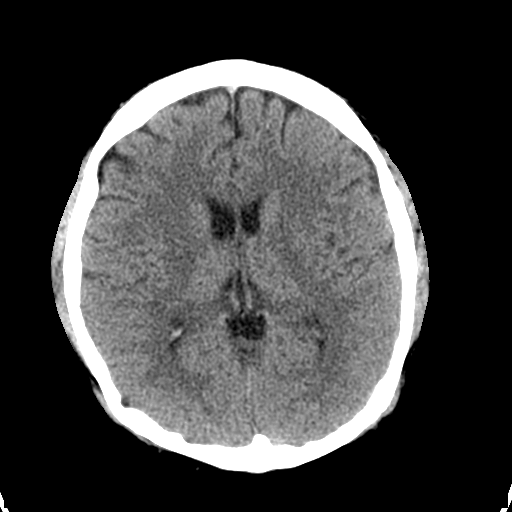
[im 14/30  bone]
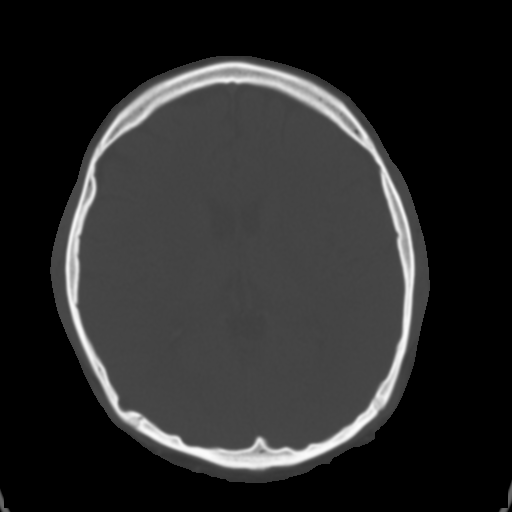
[im 17/30  brain]
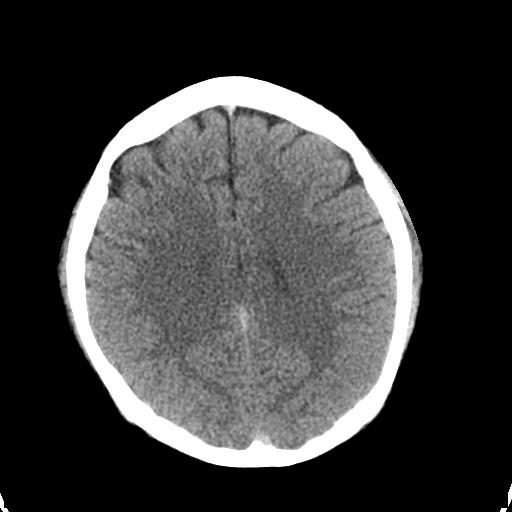
[im 20/30  brain]
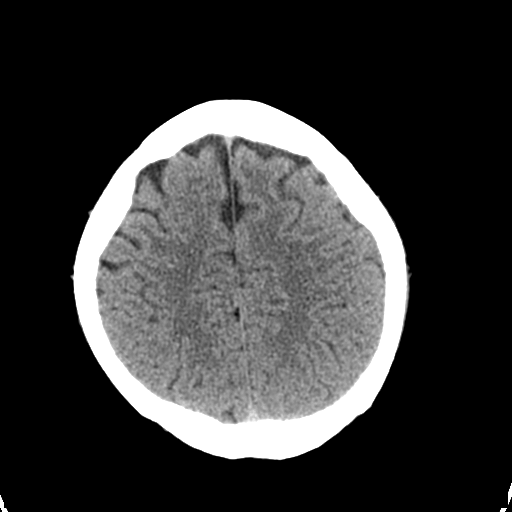
[im 23/30  brain]
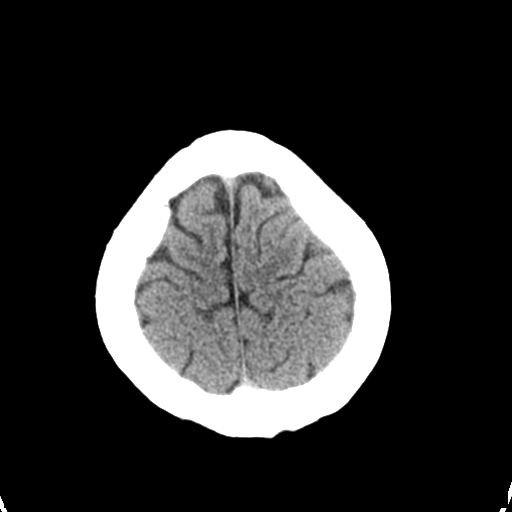
[im 25/30  brain]
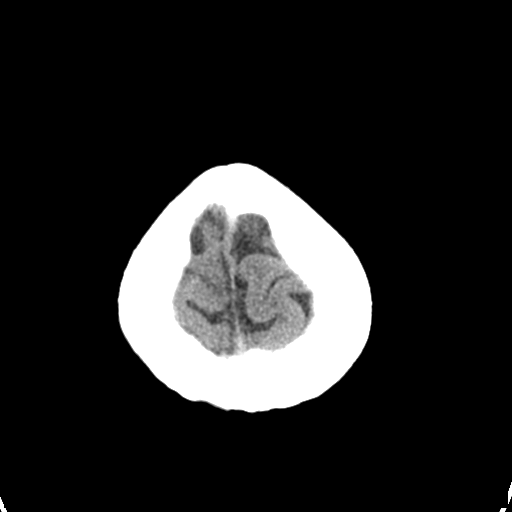
[im 25/30  bone]
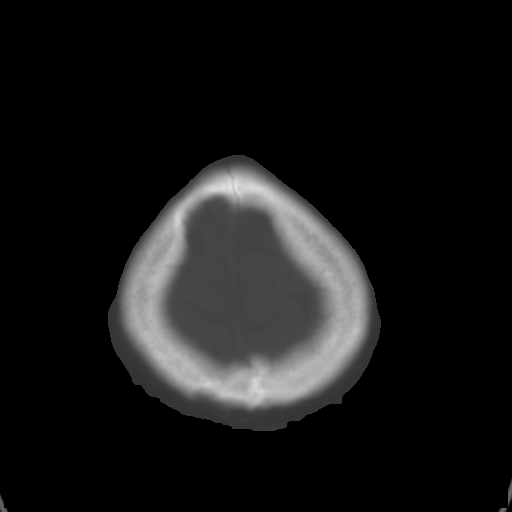
[im 28/30  brain]
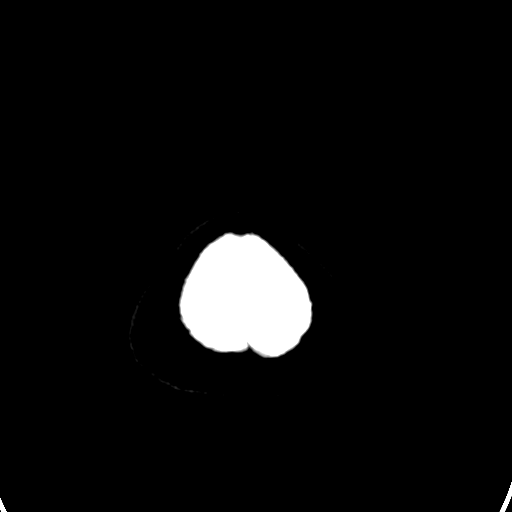

[Series 4: coronal soft tissue · coronal · 0.28mm/px · 3 of 62 slices shown]
[im 21/62  brain]
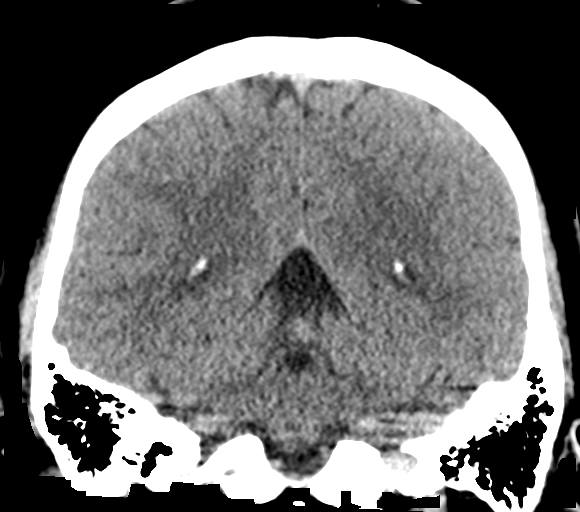
[im 28/62  brain]
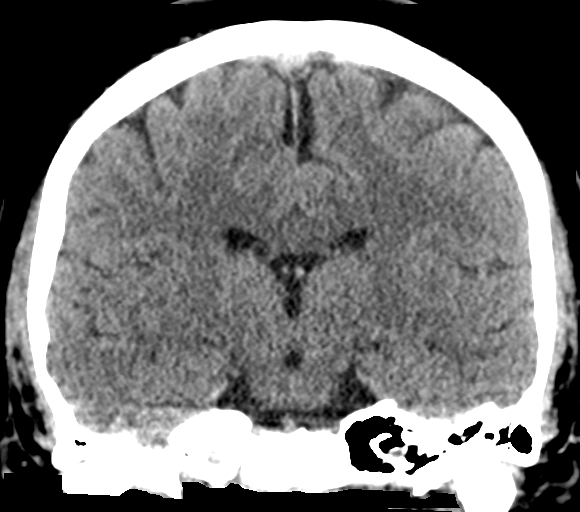
[im 34/62  brain]
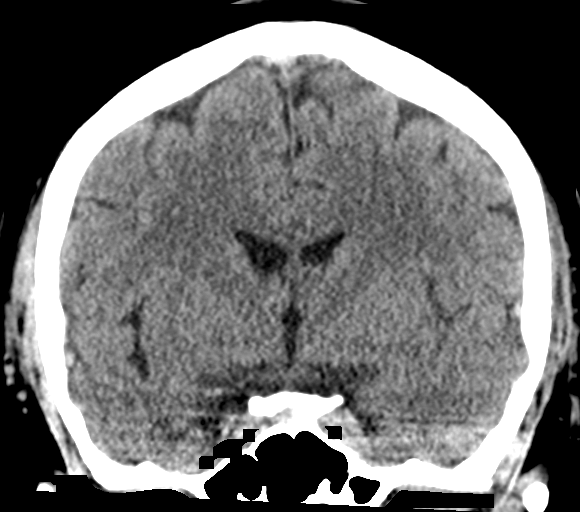

[Series 5: sagittal soft tissue · sagittal · 0.28mm/px · 3 of 55 slices shown]
[im 19/55  brain]
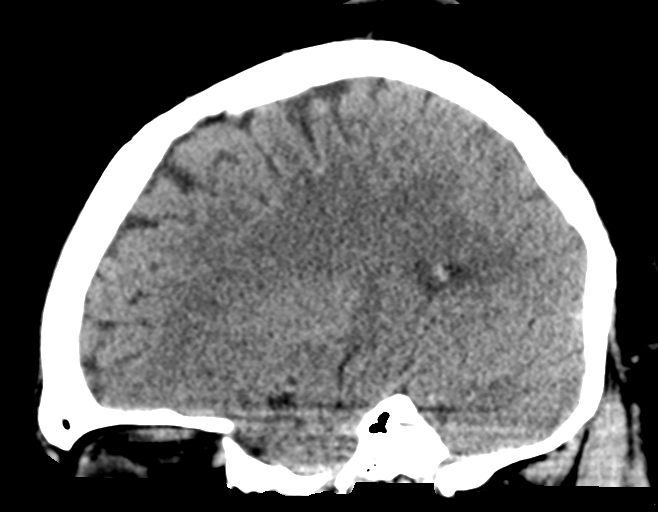
[im 28/55  brain]
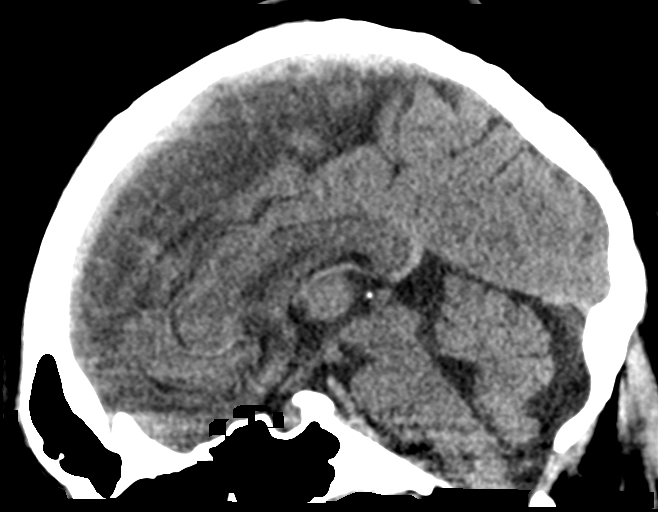
[im 37/55  brain]
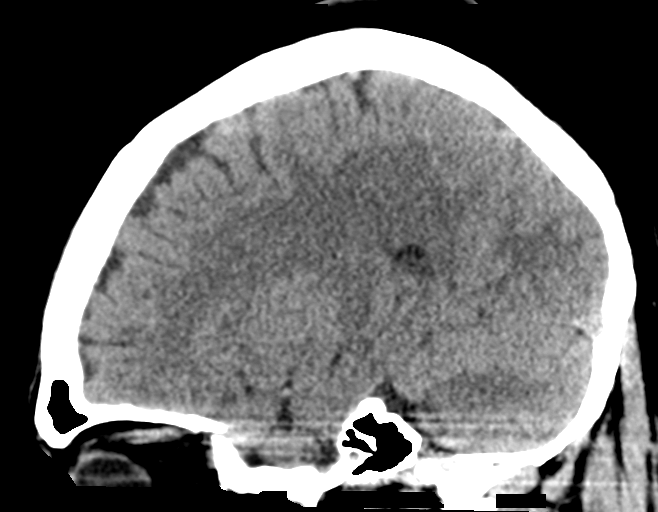

[16 of 47 positions shown; findings below may reference images not displayed]

FINDINGS: Brain: No evidence of acute infarction, hemorrhage, hydrocephalus,
extra-axial collection or mass lesion/mass effect.

Vascular: No hyperdense vessel or unexpected calcification.

Skull: Normal. Negative for fracture or focal lesion.

Sinuses/Orbits: No acute finding.

Other: None
IMPRESSION: No CT evidence for acute intracranial abnormality

## 2019-07-27 ENCOUNTER — Other Ambulatory Visit: Payer: Self-pay

## 2019-07-27 DIAGNOSIS — Z20822 Contact with and (suspected) exposure to covid-19: Secondary | ICD-10-CM

## 2019-08-01 LAB — NOVEL CORONAVIRUS, NAA: SARS-CoV-2, NAA: DETECTED — AB

## 2020-10-31 DIAGNOSIS — Z8639 Personal history of other endocrine, nutritional and metabolic disease: Secondary | ICD-10-CM | POA: Diagnosis not present

## 2020-10-31 DIAGNOSIS — Z03818 Encounter for observation for suspected exposure to other biological agents ruled out: Secondary | ICD-10-CM | POA: Diagnosis not present

## 2020-10-31 DIAGNOSIS — H66003 Acute suppurative otitis media without spontaneous rupture of ear drum, bilateral: Secondary | ICD-10-CM | POA: Diagnosis not present

## 2020-10-31 DIAGNOSIS — J101 Influenza due to other identified influenza virus with other respiratory manifestations: Secondary | ICD-10-CM | POA: Diagnosis not present

## 2020-10-31 DIAGNOSIS — J019 Acute sinusitis, unspecified: Secondary | ICD-10-CM | POA: Diagnosis not present

## 2021-09-22 ENCOUNTER — Emergency Department
Admission: EM | Admit: 2021-09-22 | Discharge: 2021-09-22 | Disposition: A | Discharge status: Home / Self Care | Payer: 59 | Attending: Emergency Medicine | Admitting: Emergency Medicine

## 2021-09-22 ENCOUNTER — Other Ambulatory Visit: Payer: Self-pay

## 2021-09-22 DIAGNOSIS — S3093XA Unspecified superficial injury of penis, initial encounter: Secondary | ICD-10-CM | POA: Diagnosis present

## 2021-09-22 DIAGNOSIS — X58XXXA Exposure to other specified factors, initial encounter: Secondary | ICD-10-CM | POA: Diagnosis not present

## 2021-09-22 DIAGNOSIS — I10 Essential (primary) hypertension: Secondary | ICD-10-CM | POA: Insufficient documentation

## 2021-09-22 DIAGNOSIS — S3121XA Laceration without foreign body of penis, initial encounter: Secondary | ICD-10-CM | POA: Insufficient documentation

## 2021-09-22 DIAGNOSIS — S3122XA Laceration with foreign body of penis, initial encounter: Secondary | ICD-10-CM | POA: Diagnosis not present

## 2021-09-22 DIAGNOSIS — E119 Type 2 diabetes mellitus without complications: Secondary | ICD-10-CM | POA: Insufficient documentation

## 2021-09-22 LAB — URINALYSIS, COMPLETE (UACMP) WITH MICROSCOPIC
Bacteria, UA: NONE SEEN
Bilirubin Urine: NEGATIVE
Glucose, UA: NEGATIVE mg/dL
Ketones, ur: NEGATIVE mg/dL
Leukocytes,Ua: NEGATIVE
Nitrite: NEGATIVE
Protein, ur: NEGATIVE mg/dL
Specific Gravity, Urine: 1.01 (ref 1.005–1.030)
Squamous Epithelial / LPF: NONE SEEN (ref 0–5)
pH: 6.5 (ref 5.0–8.0)

## 2021-09-22 LAB — CHLAMYDIA/NGC RT PCR (ARMC ONLY)
Chlamydia Tr: NOT DETECTED
N gonorrhoeae: NOT DETECTED

## 2021-09-22 NOTE — Discharge Instructions (Signed)
Keep the wound clean. Wash after urinating. Avoid any trauma including sex until it is fully healed. Follow up with your doctor. Return to the ER for signs of infection which include pus, redness of the skin, or fever

## 2021-09-22 NOTE — ED Triage Notes (Signed)
Presents with bleeding from torn foreskin following intercourse with girlfriend. Intermittent pain.

## 2021-09-22 NOTE — ED Provider Notes (Signed)
[facility] Provider Note    Event Date/Time   First MD Initiated Contact with Patient 09/22/21 [phone]     (approximate)   History   Penile Discharge   HPI  Jeffrey Frank is a 26 y.o. male  who presents for evaluation of penile trauma.  Patient reports that he was having sex with his girlfriend when he sustained a small tear in the foreskin.  He said it bled a little bit but stopped.  Later when he went to pee was bleeding again which prompted his visit to the ER.  Is complaining of mild throbbing pain.      Past Medical History:  Diagnosis Date   Diabetes mellitus without complication (HCC)    HTN (hypertension)    Obesity     Past Surgical History:  Procedure Laterality Date   NO PAST SURGERIES       Physical Exam   Triage Vital Signs: ED Triage Vitals  Enc Vitals Group     BP 09/22/21 0201 135/80     Pulse Rate 09/22/21 0201 78     Resp 09/22/21 0201 18     Temp 09/22/21 0201 98.9 F (37.2 C)     Temp Source 09/22/21 0201 Oral     SpO2 09/22/21 0201 94 %     Weight --      Height 09/22/21 0204 5\' 5"  (1.651 m)     Head Circumference --      Peak Flow --      Pain Score 09/22/21 0203 0     Pain Loc --      Pain Edu? --      Excl. in GC? --     Most recent vital signs: Vitals:   09/22/21 0201  BP: 135/80  Pulse: 78  Resp: 18  Temp: 98.9 F (37.2 C)  SpO2: 94%     Constitutional: Alert and oriented. Well appearing and in no apparent distress. HEENT:      Head: Normocephalic and atraumatic.         Eyes: Conjunctivae are normal. Sclera is non-icteric.       Mouth/Throat: Mucous membranes are moist.       Neck: Supple with no signs of meningismus. Cardiovascular: Regular rate and rhythm.  Respiratory: Normal respiratory effort.  Gastrointestinal: Soft, non tender, and non distended with positive bowel sounds. No rebound or guarding. Genitourinary: Small superficial laceration of foreskin of the penis with no active  bleeding Musculoskeletal:  No edema, cyanosis, or erythema of extremities. Neurologic: Normal speech and language. Face is symmetric. Moving all extremities. No gross focal neurologic deficits are appreciated. Skin: Skin is warm, dry and intact. No rash noted. Psychiatric: Mood and affect are normal. Speech and behavior are normal.  ED Results / Procedures / Treatments   Labs (all labs ordered are listed, but only abnormal results are displayed) Labs Reviewed  CHLAMYDIA/NGC RT PCR ([facility] ONLY)            URINALYSIS, COMPLETE (UACMP) WITH MICROSCOPIC     EKG  none   RADIOLOGY none   PROCEDURES:  Critical Care performed: No  Procedures    IMPRESSION / MDM / ASSESSMENT AND PLAN / ED COURSE  I reviewed the triage vital signs and the nursing notes.  26 y.o. male  who presents for evaluation of penile trauma.  Patient with a small superficial laceration of the foreskin of his penis.  No indication for laceration repair.  Discussed wound care and  follow-up with PCP.  Discussed my standard return precautions for any signs of infection   FINAL CLINICAL IMPRESSION(S) / ED DIAGNOSES   Final diagnoses:  Laceration of foreskin     Rx / DC Orders   ED Discharge Orders     None        Note:  This document was prepared using Dragon voice recognition software and may include unintentional dictation errors.   Please note:  Patient was evaluated in Emergency Department today for the symptoms described in the history of present illness. Patient was evaluated in the context of the global COVID-19 pandemic, which necessitated consideration that the patient might be at risk for infection with the SARS-CoV-2 virus that causes COVID-19. Institutional protocols and algorithms that pertain to the evaluation of patients at risk for COVID-19 are in a state of rapid change based on information released by regulatory bodies including the CDC and federal and state organizations. These  policies and algorithms were followed during the patient's care in the ED.  Some ED evaluations and interventions may be delayed as a result of limited staffing during the pandemic.       Don Perking, [place], MD 09/22/21 [phone]

## 2021-09-25 DIAGNOSIS — S3121XA Laceration without foreign body of penis, initial encounter: Secondary | ICD-10-CM | POA: Diagnosis not present

## 2021-11-12 DIAGNOSIS — L989 Disorder of the skin and subcutaneous tissue, unspecified: Secondary | ICD-10-CM | POA: Diagnosis not present

## 2021-11-21 DIAGNOSIS — L989 Disorder of the skin and subcutaneous tissue, unspecified: Secondary | ICD-10-CM | POA: Diagnosis not present

## 2021-11-21 DIAGNOSIS — M6740 Ganglion, unspecified site: Secondary | ICD-10-CM | POA: Diagnosis not present

## 2021-12-07 DIAGNOSIS — M6740 Ganglion, unspecified site: Secondary | ICD-10-CM | POA: Diagnosis not present

## 2021-12-07 DIAGNOSIS — M60241 Foreign body granuloma of soft tissue, not elsewhere classified, right hand: Secondary | ICD-10-CM | POA: Diagnosis not present

## 2022-03-15 DIAGNOSIS — L301 Dyshidrosis [pompholyx]: Secondary | ICD-10-CM | POA: Diagnosis not present

## 2022-07-05 DIAGNOSIS — M79641 Pain in right hand: Secondary | ICD-10-CM | POA: Diagnosis not present

## 2022-07-05 DIAGNOSIS — S6991XA Unspecified injury of right wrist, hand and finger(s), initial encounter: Secondary | ICD-10-CM | POA: Diagnosis not present

## 2022-07-05 DIAGNOSIS — R030 Elevated blood-pressure reading, without diagnosis of hypertension: Secondary | ICD-10-CM | POA: Diagnosis not present

## 2022-12-05 DIAGNOSIS — F331 Major depressive disorder, recurrent, moderate: Secondary | ICD-10-CM | POA: Diagnosis not present

## 2022-12-05 DIAGNOSIS — F411 Generalized anxiety disorder: Secondary | ICD-10-CM | POA: Diagnosis not present

## 2022-12-12 DIAGNOSIS — F331 Major depressive disorder, recurrent, moderate: Secondary | ICD-10-CM | POA: Diagnosis not present

## 2022-12-12 DIAGNOSIS — F411 Generalized anxiety disorder: Secondary | ICD-10-CM | POA: Diagnosis not present

## 2022-12-18 DIAGNOSIS — F411 Generalized anxiety disorder: Secondary | ICD-10-CM | POA: Diagnosis not present

## 2022-12-18 DIAGNOSIS — F331 Major depressive disorder, recurrent, moderate: Secondary | ICD-10-CM | POA: Diagnosis not present

## 2023-06-26 DIAGNOSIS — M79641 Pain in right hand: Secondary | ICD-10-CM | POA: Diagnosis not present

## 2023-06-26 DIAGNOSIS — M7989 Other specified soft tissue disorders: Secondary | ICD-10-CM | POA: Diagnosis not present

## 2024-09-16 ENCOUNTER — Other Ambulatory Visit: Payer: Self-pay

## 2024-09-16 ENCOUNTER — Emergency Department
Admission: EM | Admit: 2024-09-16 | Discharge: 2024-09-16 | Disposition: A | Discharge status: Home / Self Care | Payer: Self-pay | Attending: Emergency Medicine | Admitting: Emergency Medicine

## 2024-09-16 ENCOUNTER — Emergency Department: Payer: Self-pay

## 2024-09-16 DIAGNOSIS — K8020 Calculus of gallbladder without cholecystitis without obstruction: Secondary | ICD-10-CM | POA: Insufficient documentation

## 2024-09-16 DIAGNOSIS — I10 Essential (primary) hypertension: Secondary | ICD-10-CM | POA: Insufficient documentation

## 2024-09-16 DIAGNOSIS — F1010 Alcohol abuse, uncomplicated: Secondary | ICD-10-CM | POA: Insufficient documentation

## 2024-09-16 DIAGNOSIS — Y909 Presence of alcohol in blood, level not specified: Secondary | ICD-10-CM | POA: Insufficient documentation

## 2024-09-16 DIAGNOSIS — E119 Type 2 diabetes mellitus without complications: Secondary | ICD-10-CM | POA: Insufficient documentation

## 2024-09-16 LAB — URINALYSIS, ROUTINE W REFLEX MICROSCOPIC
Bilirubin Urine: NEGATIVE
Glucose, UA: NEGATIVE mg/dL
Hgb urine dipstick: NEGATIVE
Ketones, ur: NEGATIVE mg/dL
Leukocytes,Ua: NEGATIVE
Nitrite: NEGATIVE
Protein, ur: NEGATIVE mg/dL
Specific Gravity, Urine: 1.02 (ref 1.005–1.030)
pH: 5 (ref 5.0–8.0)

## 2024-09-16 LAB — COMPREHENSIVE METABOLIC PANEL WITH GFR
ALT: 17 U/L (ref 0–44)
AST: 27 U/L (ref 15–41)
Albumin: 4.6 g/dL (ref 3.5–5.0)
Alkaline Phosphatase: 54 U/L (ref 38–126)
Anion gap: 11 (ref 5–15)
BUN: 8 mg/dL (ref 6–20)
CO2: 24 mmol/L (ref 22–32)
Calcium: 9.4 mg/dL (ref 8.9–10.3)
Chloride: 103 mmol/L (ref 98–111)
Creatinine, Ser: 1.01 mg/dL (ref 0.61–1.24)
GFR, Estimated: >60 mL/min
Glucose, Bld: 120 mg/dL — ABNORMAL HIGH (ref 70–99)
Potassium: 3.3 mmol/L — ABNORMAL LOW (ref 3.5–5.1)
Sodium: 138 mmol/L (ref 135–145)
Total Bilirubin: 2.4 mg/dL — ABNORMAL HIGH (ref 0.0–1.2)
Total Protein: 7.1 g/dL (ref 6.5–8.1)

## 2024-09-16 LAB — CBC
HCT: 40.6 % (ref 39.0–52.0)
Hemoglobin: 14.3 g/dL (ref 13.0–17.0)
MCH: 31.8 pg (ref 26.0–34.0)
MCHC: 35.2 g/dL (ref 30.0–36.0)
MCV: 90.4 fL (ref 80.0–100.0)
Platelets: 316 10*3/uL (ref 150–400)
RBC: 4.49 MIL/uL (ref 4.22–5.81)
RDW: 12.6 % (ref 11.5–15.5)
WBC: 5.1 10*3/uL (ref 4.0–10.5)
nRBC: 0 % (ref 0.0–0.2)

## 2024-09-16 LAB — LIPASE, BLOOD: Lipase: 13 U/L (ref 11–51)

## 2024-09-16 MED ORDER — CHLORDIAZEPOXIDE HCL 25 MG PO CAPS: ORAL_CAPSULE | ORAL | 0 refills | Status: DC

## 2024-09-16 MED ORDER — CHLORDIAZEPOXIDE HCL 25 MG PO CAPS: ORAL_CAPSULE | ORAL | 0 refills | Status: AC

## 2024-09-16 NOTE — ED Triage Notes (Signed)
 Pt comes with right sided pain for month. Pt state it comes and goes. Pt states vomiting some blood. Pt denies any urinary symptoms.
